# Patient Record
Sex: Female | Born: 1960 | Race: White | Hispanic: No | Marital: Married | Smoking: Former smoker
Health system: Southern US, Community
[De-identification: ages and names within clinical notes are randomized; demographics above are authoritative.]

## PROBLEM LIST (undated history)

## (undated) DIAGNOSIS — T7840XA Allergy, unspecified, initial encounter: Secondary | ICD-10-CM

## (undated) DIAGNOSIS — F419 Anxiety disorder, unspecified: Secondary | ICD-10-CM

## (undated) DIAGNOSIS — E7800 Pure hypercholesterolemia, unspecified: Secondary | ICD-10-CM

## (undated) HISTORY — DX: Anxiety disorder, unspecified: F41.9

## (undated) HISTORY — PX: RHINOPLASTY: SUR1284

## (undated) HISTORY — DX: Allergy, unspecified, initial encounter: T78.40XA

## (undated) HISTORY — PX: POLYPECTOMY: SHX149

## (undated) HISTORY — DX: Pure hypercholesterolemia, unspecified: E78.00

## (undated) HISTORY — PX: TUBAL LIGATION: SHX77

---

## 2007-08-08 ENCOUNTER — Encounter: Admission: RE | Admit: 2007-08-08 | Discharge: 2007-08-08 | Payer: Self-pay | Admitting: Obstetrics and Gynecology

## 2010-01-14 ENCOUNTER — Emergency Department (HOSPITAL_COMMUNITY): Admission: EM | Admit: 2010-01-14 | Discharge: 2010-01-15 | Payer: Self-pay | Admitting: Emergency Medicine
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
## 2011-07-26 ENCOUNTER — Other Ambulatory Visit: Payer: Self-pay | Admitting: Internal Medicine

## 2011-07-26 DIAGNOSIS — N6310 Unspecified lump in the right breast, unspecified quadrant: Secondary | ICD-10-CM

## 2011-08-02 ENCOUNTER — Ambulatory Visit
Admission: RE | Admit: 2011-08-02 | Discharge: 2011-08-02 | Disposition: A | Discharge status: Home / Self Care | Payer: BC Managed Care – PPO | Source: Ambulatory Visit | Attending: Internal Medicine | Admitting: Internal Medicine

## 2011-08-02 ENCOUNTER — Other Ambulatory Visit: Payer: Self-pay | Admitting: Internal Medicine

## 2011-08-02 DIAGNOSIS — N6310 Unspecified lump in the right breast, unspecified quadrant: Secondary | ICD-10-CM

## 2011-08-08 ENCOUNTER — Other Ambulatory Visit: Payer: Self-pay

## 2011-10-16 HISTORY — PX: COLONOSCOPY: SHX174

## 2012-07-30 ENCOUNTER — Encounter: Payer: Self-pay | Admitting: Internal Medicine

## 2012-08-04 ENCOUNTER — Other Ambulatory Visit: Payer: Self-pay | Admitting: Internal Medicine

## 2012-08-04 DIAGNOSIS — Z1231 Encounter for screening mammogram for malignant neoplasm of breast: Secondary | ICD-10-CM

## 2012-09-05 ENCOUNTER — Ambulatory Visit: Payer: BC Managed Care – PPO

## 2012-09-08 ENCOUNTER — Ambulatory Visit (AMBULATORY_SURGERY_CENTER): Payer: BC Managed Care – PPO

## 2012-09-08 VITALS — Ht 66.0 in | Wt 181.0 lb

## 2012-09-08 DIAGNOSIS — Z1211 Encounter for screening for malignant neoplasm of colon: Secondary | ICD-10-CM

## 2012-09-08 MED ORDER — MOVIPREP 100 G PO SOLR: 1.0000 | Freq: Once | ORAL | Status: DC

## 2012-09-25 ENCOUNTER — Encounter: Payer: Self-pay | Admitting: Internal Medicine

## 2012-09-25 ENCOUNTER — Ambulatory Visit (AMBULATORY_SURGERY_CENTER): Payer: BC Managed Care – PPO | Admitting: Internal Medicine

## 2012-09-25 VITALS — BP 121/66 | HR 71 | Temp 98.2°F | Resp 26 | Ht 66.0 in | Wt 181.0 lb

## 2012-09-25 DIAGNOSIS — Z1211 Encounter for screening for malignant neoplasm of colon: Secondary | ICD-10-CM

## 2012-09-25 DIAGNOSIS — D126 Benign neoplasm of colon, unspecified: Secondary | ICD-10-CM

## 2012-09-25 DIAGNOSIS — K635 Polyp of colon: Secondary | ICD-10-CM

## 2012-09-25 MED ORDER — SODIUM CHLORIDE 0.9 % IV SOLN: 500.0000 mL | INTRAVENOUS | Status: DC

## 2012-09-25 NOTE — Progress Notes (Signed)
Patient did not have preoperative order for IV antibiotic SSI prophylaxis. [phone])  Patient did not experience any of the following events: a burn prior to discharge; a fall within the facility; wrong site/side/patient/procedure/implant event; or a hospital transfer or hospital admission upon discharge from the facility. [phone]) Per Haynes Bast, RN

## 2012-09-25 NOTE — Progress Notes (Signed)
Called to room to assist during endoscopic procedure.  Patient ID and intended procedure confirmed with present staff. Received instructions for my participation in the procedure from the performing physician.  

## 2012-09-25 NOTE — Patient Instructions (Addendum)
Impressions/recommendations:  Polyps (handout given) Diverticulosis (handout given) Hemorrhoids (handout given)  High Fiber Diet given  Repeat colonoscopy pending upon pathology results.  YOU HAD AN ENDOSCOPIC PROCEDURE TODAY AT THE [facility]: Refer to the procedure report that was given to you for any specific questions about what was found during the examination.  If the procedure report does not answer your questions, please call your gastroenterologist to clarify.  If you requested that your care partner not be given the details of your procedure findings, then the procedure report has been included in a sealed envelope for you to review at your convenience later.  YOU SHOULD EXPECT: Some feelings of bloating in the abdomen. Passage of more gas than usual.  Walking can help get rid of the air that was put into your GI tract during the procedure and reduce the bloating. If you had a lower endoscopy (such as a colonoscopy or flexible sigmoidoscopy) you may notice spotting of blood in your stool or on the toilet paper. If you underwent a bowel prep for your procedure, then you may not have a normal bowel movement for a few days.  DIET: Your first meal following the procedure should be a light meal and then it is ok to progress to your normal diet.  A half-sandwich or bowl of soup is an example of a good first meal.  Heavy or fried foods are harder to digest and may make you feel nauseous or bloated.  Likewise meals heavy in dairy and vegetables can cause extra gas to form and this can also increase the bloating.  Drink plenty of fluids but you should avoid alcoholic beverages for 24 hours.  ACTIVITY: Your care partner should take you home directly after the procedure.  You should plan to take it easy, moving slowly for the rest of the day.  You can resume normal activity the day after the procedure however you should NOT DRIVE or use heavy machinery for 24 hours (because of the  sedation medicines used during the test).    SYMPTOMS TO REPORT IMMEDIATELY: A gastroenterologist can be reached at any hour.  During normal business hours, 8:30 AM to 5:00 PM Monday through Friday, call [phone].  After hours and on weekends, please call the GI answering service at [phone] who will take a message and have the physician on call contact you.   Following lower endoscopy (colonoscopy or flexible sigmoidoscopy):  Excessive amounts of blood in the stool  Significant tenderness or worsening of abdominal pains  Swelling of the abdomen that is new, acute  Fever of 100F or higher   FOLLOW UP: If any biopsies were taken you will be contacted by phone or by letter within the next 1-3 weeks.  Call your gastroenterologist if you have not heard about the biopsies in 3 weeks.  Our staff will call the home number listed on your records the next business day following your procedure to check on you and address any questions or concerns that you may have at that time regarding the information given to you following your procedure. This is a courtesy call and so if there is no answer at the home number and we have not heard from you through the emergency physician on call, we will assume that you have returned to your regular daily activities without incident.  SIGNATURES/CONFIDENTIALITY: You and/or your care partner have signed paperwork which will be entered into your electronic medical record.  These signatures attest to the  fact that that the information above on your After Visit Summary has been reviewed and is understood.  Full responsibility of the confidentiality of this discharge information lies with you and/or your care-partner.
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
## 2012-09-25 NOTE — Op Note (Signed)
[facility] [address] [place], [address]   COLONOSCOPY PROCEDURE REPORT  PATIENT: Christy Diaz, Christy Diaz  MR#: [id number] BIRTHDATE: 08-19-1961 , 51  yrs. old GENDER: Female ENDOSCOPIST: Beverley Fiedler, MD REFERRED AO:[id number], Daniel PROCEDURE DATE:  09/25/2012 PROCEDURE:   Colonoscopy with snare polypectomy ASA CLASS:   Class I INDICATIONS:average risk screening and first colonoscopy. MEDICATIONS: MAC sedation, administered by CRNA and propofol (Diprivan) 300mg  IV  DESCRIPTION OF PROCEDURE:   After the risks benefits and alternatives of the procedure were thoroughly explained, informed consent was obtained.  A digital rectal exam revealed no rectal mass and A digital rectal exam revealed external hemorrhoids.   The LB [id number]  endoscope was introduced through the anus and advanced to the cecum, which was identified by both the appendix and ileocecal valve. No adverse events experienced.   The quality of the prep was good, using MoviPrep  The instrument was then slowly withdrawn as the colon was fully examined.  COLON FINDINGS: Two sessile polyps measuring 3-5 mm in size were found at the hepatic flexure.  Polypectomy was performed with cold forceps (1) and using cold snare (1).  All resections were complete and all polyp tissue was completely retrieved.   A sessile polyp measuring 4 mm in size with a mucous cap was found in the rectum. A polypectomy was performed with a cold snare.  The resection was complete and the polyp tissue was completely retrieved.   Mild diverticulosis was noted in the sigmoid colon. Retroflexed views revealed small external hemorrhoids. The time to cecum=3 minutes 39 seconds.  Withdrawal time=13 minutes 49 seconds.  The scope was withdrawn and the procedure completed. COMPLICATIONS: There were no complications.  ENDOSCOPIC IMPRESSION: 1.   Two sessile polyps measuring 3-5 mm in size were found at the hepatic flexure;  Polypectomy was performed with cold forceps and using cold snare 2.   Sessile polyp measuring 4 mm in size was found in the rectum; polypectomy was performed with a cold snare 3.   Mild diverticulosis was noted in the sigmoid colon 4.   Small external hemorrhoids  RECOMMENDATIONS: 1.  Await pathology results 2.  High fiber diet 3.  If the polyps removed today are proven to be adenomatous (pre-cancerous) polyps, you will need a colonoscopy in 3 years. Otherwise you should continue to follow colorectal cancer screening guidelines for "routine risk" patients with a colonoscopy in 10 years.  You will receive a letter within 1-2 weeks with the results of your biopsy as well as final recommendations.  Please call my office if you have not received a letter after 3 weeks.  eSigned:  Beverley Fiedler, MD 09/25/2012 11:25 AM     cc: Jarome Matin, MD and The Patient

## 2012-09-26 ENCOUNTER — Telehealth: Payer: Self-pay | Admitting: *Deleted

## 2012-09-26 NOTE — Telephone Encounter (Signed)
  Follow up Call-  Call back number 09/25/2012  Post procedure Call Back phone  # [phone]  Permission to leave phone message Yes     Patient questions:  Do you have a fever, pain , or abdominal swelling? no Pain Score  0 *  Have you tolerated food without any problems? yes  Have you been able to return to your normal activities? yes  Do you have any questions about your discharge instructions: Diet   no Medications  no Follow up visit  no  Do you have questions or concerns about your Care? no  Actions: * If pain score is 4 or above: No action needed, pain <4.

## 2012-10-01 ENCOUNTER — Encounter: Payer: Self-pay | Admitting: Internal Medicine

## 2012-10-22 ENCOUNTER — Ambulatory Visit
Admission: RE | Admit: 2012-10-22 | Discharge: 2012-10-22 | Disposition: A | Discharge status: Home / Self Care | Payer: BC Managed Care – PPO | Source: Ambulatory Visit | Attending: Internal Medicine | Admitting: Internal Medicine

## 2012-10-22 DIAGNOSIS — Z1231 Encounter for screening mammogram for malignant neoplasm of breast: Secondary | ICD-10-CM

## 2014-01-05 ENCOUNTER — Other Ambulatory Visit: Payer: Self-pay

## 2014-01-05 DIAGNOSIS — Z1231 Encounter for screening mammogram for malignant neoplasm of breast: Secondary | ICD-10-CM

## 2014-01-15 ENCOUNTER — Ambulatory Visit
Admission: RE | Admit: 2014-01-15 | Discharge: 2014-01-15 | Disposition: A | Discharge status: Home / Self Care | Payer: BC Managed Care – PPO | Source: Ambulatory Visit

## 2014-01-15 DIAGNOSIS — Z1231 Encounter for screening mammogram for malignant neoplasm of breast: Secondary | ICD-10-CM

## 2014-10-15 DIAGNOSIS — H9190 Unspecified hearing loss, unspecified ear: Secondary | ICD-10-CM

## 2014-10-15 HISTORY — DX: Unspecified hearing loss, unspecified ear: H91.90

## 2015-05-06 ENCOUNTER — Other Ambulatory Visit: Payer: Self-pay

## 2015-05-06 DIAGNOSIS — Z1231 Encounter for screening mammogram for malignant neoplasm of breast: Secondary | ICD-10-CM

## 2015-06-09 ENCOUNTER — Ambulatory Visit
Admission: RE | Admit: 2015-06-09 | Discharge: 2015-06-09 | Disposition: A | Discharge status: Home / Self Care | Payer: 59 | Source: Ambulatory Visit

## 2015-06-09 DIAGNOSIS — Z1231 Encounter for screening mammogram for malignant neoplasm of breast: Secondary | ICD-10-CM

## 2016-08-14 ENCOUNTER — Other Ambulatory Visit: Payer: Self-pay | Admitting: Internal Medicine

## 2016-08-14 DIAGNOSIS — Z1231 Encounter for screening mammogram for malignant neoplasm of breast: Secondary | ICD-10-CM

## 2016-09-10 ENCOUNTER — Ambulatory Visit
Admission: RE | Admit: 2016-09-10 | Discharge: 2016-09-10 | Disposition: A | Discharge status: Home / Self Care | Payer: 59 | Source: Ambulatory Visit | Attending: Internal Medicine | Admitting: Internal Medicine

## 2016-09-10 DIAGNOSIS — Z1231 Encounter for screening mammogram for malignant neoplasm of breast: Secondary | ICD-10-CM

## 2017-04-02 ENCOUNTER — Encounter: Payer: Self-pay | Admitting: Obstetrics & Gynecology

## 2017-04-02 ENCOUNTER — Ambulatory Visit (INDEPENDENT_AMBULATORY_CARE_PROVIDER_SITE_OTHER): Payer: 59 | Admitting: Obstetrics & Gynecology

## 2017-04-02 VITALS — BP 130/70 | Ht 66.0 in | Wt 175.0 lb

## 2017-04-02 DIAGNOSIS — N393 Stress incontinence (female) (male): Secondary | ICD-10-CM

## 2017-04-02 DIAGNOSIS — N952 Postmenopausal atrophic vaginitis: Secondary | ICD-10-CM

## 2017-04-02 DIAGNOSIS — Z01411 Encounter for gynecological examination (general) (routine) with abnormal findings: Secondary | ICD-10-CM | POA: Diagnosis not present

## 2017-04-02 DIAGNOSIS — Z1151 Encounter for screening for human papillomavirus (HPV): Secondary | ICD-10-CM

## 2017-04-02 MED ORDER — ESTROGENS, CONJUGATED 0.625 MG/GM VA CREA: 0.2500 | TOPICAL_CREAM | VAGINAL | 4 refills | Status: DC

## 2017-04-02 NOTE — Patient Instructions (Signed)
1. Encounter for gynecological examination with abnormal finding Normal Gyn exam except Atrophic vaginitis.  Pap/HPV done.  Breast exam normal.  Will schedule screening Mammo and Colonoscopy.  2. Postmenopausal atrophic vaginitis Will try Premarin cream.  If not helpful or too expensive, will change to Intrarosa.  Info and pamphlet given.  3. Urinary, incontinence, stress female Kegels recommended and instructions given.  Christy Diaz, it was a pleasure to meet you today!  I will inform you of your results as soon as available.   Kegel Exercises Kegel exercises help strengthen the muscles that support the rectum, vagina, small intestine, bladder, and uterus. Doing Kegel exercises can help:  Improve bladder and bowel control.  Improve sexual response.  Reduce problems and discomfort during pregnancy.  Kegel exercises involve squeezing your pelvic floor muscles, which are the same muscles you squeeze when you try to stop the flow of urine. The exercises can be done while sitting, standing, or lying down, but it is best to vary your position. Phase 1 exercises 1. Squeeze your pelvic floor muscles tight. You should feel a tight lift in your rectal area. If you are a female, you should also feel a tightness in your vaginal area. Keep your stomach, buttocks, and legs relaxed. 2. Hold the muscles tight for up to 10 seconds. 3. Relax your muscles. Repeat this exercise 50 times a day or as many times as told by your health care provider. Continue to do this exercise for at least 4-6 weeks or for as long as told by your health care provider. This information is not intended to replace advice given to you by your health care provider. Make sure you discuss any questions you have with your health care provider. Document Released: 09/17/2012 Document Revised: 05/26/2016 Document Reviewed: 08/21/2015 Elsevier Interactive Patient Education  [employer].

## 2017-04-02 NOTE — Addendum Note (Signed)
Addended by: Thurnell Garbe A on: 04/02/2017 12:24 PM   Modules accepted: Orders

## 2017-04-02 NOTE — Progress Notes (Signed)
Christy Diaz 1960-11-14 [id number]   History:    56 y.o.  G2P1A1  Married.  S/P BT/S.  Taking care of mother with Alzheimer.  Son 14 yo at [facility] [employer] [facility] [employer].  RP:  New patient presenting for annual gyn exam  HPI:  Menopause.  No HRT.  No PMB.  No pelvic pain.  Vaginal dryness with painful intercourse.  Prescribed Estrace in the past, but too expensive.  Breasts wnl.  Mild SUI, will do Kegels.  BMs wnl.  Tennis player.  BMI 28,25.    Past medical history,surgical history, family history and social history were all reviewed and documented in the EPIC chart.  Gynecologic History No LMP recorded. Patient is postmenopausal. Contraception: post menopausal status and tubal ligation Last Pap: 2014. Results were: normal Last mammogram: 08/2016. Results were: normal Colono:  Benign polyps removed 09/2012.  Will schedule this year at 4 yrs. Obstetric History OB History  Gravida Para Term Preterm AB Living  2 1     1 1   SAB TAB Ectopic Multiple Live Births               # Outcome Date GA Lbr Len/2nd Weight Sex Delivery Anes PTL Lv  2 AB           1 Para                ROS: A ROS was performed and pertinent positives and negatives are included in the history.  GENERAL: No fevers or chills. HEENT: No change in vision, no earache, sore throat or sinus congestion. NECK: No pain or stiffness. CARDIOVASCULAR: No chest pain or pressure. No palpitations. PULMONARY: No shortness of breath, cough or wheeze. GASTROINTESTINAL: No abdominal pain, nausea, vomiting or diarrhea, melena or bright red blood per rectum. GENITOURINARY: No urinary frequency, urgency, hesitancy or dysuria. MUSCULOSKELETAL: No joint or muscle pain, no back pain, no recent trauma. DERMATOLOGIC: No rash, no itching, no lesions. ENDOCRINE: No polyuria, polydipsia, no heat or cold intolerance. No recent change in weight. HEMATOLOGICAL: No anemia or easy bruising or bleeding. NEUROLOGIC: No headache, seizures, numbness, tingling or  weakness. PSYCHIATRIC: No depression, no loss of interest in normal activity or change in sleep pattern.     Exam:   BP 130/70   Ht 5\' 6"  (1.676 m)   Wt 175 lb (79.4 kg)   BMI 28.25 kg/m   Body mass index is 28.25 kg/m.  General appearance : Well developed well nourished female. No acute distress HEENT: Eyes: no retinal hemorrhage or exudates,  Neck supple, trachea midline, no carotid bruits, no thyroidmegaly Lungs: Clear to auscultation, no rhonchi or wheezes, or rib retractions  Heart: Regular rate and rhythm, no murmurs or gallops Breast:Examined in sitting and supine position were symmetrical in appearance, no palpable masses or tenderness,  no skin retraction, no nipple inversion, no nipple discharge, no skin discoloration, no axillary or supraclavicular lymphadenopathy Abdomen: no palpable masses or tenderness, no rebound or guarding Extremities: no edema or skin discoloration or tenderness  Pelvic: Vulva Mild Atrophy  Bartholin, Urethra, Skene Glands: Within normal limits             Vagina: No gross lesions or discharge  Cervix: No gross lesions or discharge.  Pap/HPV done.  Uterus  AV, normal size, shape and consistency, non-tender and mobile  Adnexa  Without masses or tenderness  Anus and perineum  normal     Assessment/Plan:  56 y.o. female for annual exam   1. Encounter for  gynecological examination with abnormal finding Normal Gyn exam except Atrophic vaginitis.  Pap/HPV done.  Breast exam normal.  Will schedule screening Mammo and Colonoscopy.  2. Postmenopausal atrophic vaginitis Will try Premarin cream.  If not helpful or too expensive, will change to Intrarosa.  Info and pamphlet given.  3. Urinary, incontinence, stress female Kegels recommended and instructions given.  Counseling on above issues >50% x 10 minutes  Princess Bruins MD, 10:44 AM 04/02/2017

## 2017-04-05 LAB — PAP, TP IMAGING W/ HPV RNA, RFLX HPV TYPE 16,18/45: HPV mRNA, High Risk: NOT DETECTED

## 2017-09-17 ENCOUNTER — Other Ambulatory Visit: Payer: Self-pay | Admitting: Internal Medicine

## 2017-09-17 DIAGNOSIS — Z1231 Encounter for screening mammogram for malignant neoplasm of breast: Secondary | ICD-10-CM

## 2017-09-19 ENCOUNTER — Ambulatory Visit
Admission: RE | Admit: 2017-09-19 | Discharge: 2017-09-19 | Disposition: A | Discharge status: Home / Self Care | Payer: 59 | Source: Ambulatory Visit | Attending: Internal Medicine | Admitting: Internal Medicine

## 2017-09-19 DIAGNOSIS — Z1231 Encounter for screening mammogram for malignant neoplasm of breast: Secondary | ICD-10-CM

## 2017-10-18 ENCOUNTER — Encounter: Payer: Self-pay | Admitting: Internal Medicine

## 2018-07-31 ENCOUNTER — Other Ambulatory Visit: Payer: Self-pay | Admitting: Internal Medicine

## 2018-07-31 DIAGNOSIS — Z1231 Encounter for screening mammogram for malignant neoplasm of breast: Secondary | ICD-10-CM

## 2018-09-22 ENCOUNTER — Ambulatory Visit
Admission: RE | Admit: 2018-09-22 | Discharge: 2018-09-22 | Disposition: A | Discharge status: Home / Self Care | Payer: Managed Care, Other (non HMO) | Source: Ambulatory Visit | Attending: Internal Medicine | Admitting: Internal Medicine

## 2018-09-22 DIAGNOSIS — Z1231 Encounter for screening mammogram for malignant neoplasm of breast: Secondary | ICD-10-CM

## 2018-10-20 ENCOUNTER — Encounter: Payer: Self-pay | Admitting: Obstetrics & Gynecology

## 2018-10-20 ENCOUNTER — Ambulatory Visit (INDEPENDENT_AMBULATORY_CARE_PROVIDER_SITE_OTHER): Payer: Managed Care, Other (non HMO) | Admitting: Obstetrics & Gynecology

## 2018-10-20 VITALS — BP 124/80 | Ht 65.75 in | Wt 187.0 lb

## 2018-10-20 DIAGNOSIS — N952 Postmenopausal atrophic vaginitis: Secondary | ICD-10-CM

## 2018-10-20 DIAGNOSIS — Z780 Asymptomatic menopausal state: Secondary | ICD-10-CM

## 2018-10-20 DIAGNOSIS — E6609 Other obesity due to excess calories: Secondary | ICD-10-CM

## 2018-10-20 DIAGNOSIS — Z01419 Encounter for gynecological examination (general) (routine) without abnormal findings: Secondary | ICD-10-CM

## 2018-10-20 DIAGNOSIS — Z6830 Body mass index (BMI) 30.0-30.9, adult: Secondary | ICD-10-CM

## 2018-10-20 NOTE — Progress Notes (Signed)
Christy Diaz 1961-09-17 [id number]   History:    58 y.o. G2P1A1L1 Married  RP:  Established patient presenting for annual gyn exam   HPI: Menopause, well on no HRT.  No PMB.  No pelvic pain.  Rarely sexually active because of dryness/pain with IC.  Urine/BMs wnl.  BMI 30.41.  Plays tennis 2 times/week.  Health labs with Fam MD.  Past medical history,surgical history, family history and social history were all reviewed and documented in the EPIC chart.  Gynecologic History No LMP recorded. Patient is postmenopausal. Contraception: post menopausal status Last Pap: 03/2017. Results were: Negative/HPV HR neg Last mammogram: 09/2018. Results were: Negative Bone Density: Never Colonoscopy: 2013  Obstetric History OB History  Gravida Para Term Preterm AB Living  2 1     1 1   SAB TAB Ectopic Multiple Live Births               # Outcome Date GA Lbr Len/2nd Weight Sex Delivery Anes PTL Lv  2 AB           1 Para              ROS: A ROS was performed and pertinent positives and negatives are included in the history.  GENERAL: No fevers or chills. HEENT: No change in vision, no earache, sore throat or sinus congestion. NECK: No pain or stiffness. CARDIOVASCULAR: No chest pain or pressure. No palpitations. PULMONARY: No shortness of breath, cough or wheeze. GASTROINTESTINAL: No abdominal pain, nausea, vomiting or diarrhea, melena or bright red blood per rectum. GENITOURINARY: No urinary frequency, urgency, hesitancy or dysuria. MUSCULOSKELETAL: No joint or muscle pain, no back pain, no recent trauma. DERMATOLOGIC: No rash, no itching, no lesions. ENDOCRINE: No polyuria, polydipsia, no heat or cold intolerance. No recent change in weight. HEMATOLOGICAL: No anemia or easy bruising or bleeding. NEUROLOGIC: No headache, seizures, numbness, tingling or weakness. PSYCHIATRIC: No depression, no loss of interest in normal activity or change in sleep pattern.     Exam:   BP 124/80   Ht 5' 5.75"  (1.67 m)   Wt 187 lb (84.8 kg)   BMI 30.41 kg/m   Body mass index is 30.41 kg/m.  General appearance : Well developed well nourished female. No acute distress HEENT: Eyes: no retinal hemorrhage or exudates,  Neck supple, trachea midline, no carotid bruits, no thyroidmegaly Lungs: Clear to auscultation, no rhonchi or wheezes, or rib retractions  Heart: Regular rate and rhythm, no murmurs or gallops Breast:Examined in sitting and supine position were symmetrical in appearance, no palpable masses or tenderness,  no skin retraction, no nipple inversion, no nipple discharge, no skin discoloration, no axillary or supraclavicular lymphadenopathy Abdomen: no palpable masses or tenderness, no rebound or guarding Extremities: no edema or skin discoloration or tenderness  Pelvic: Vulva: Normal             Vagina: No gross lesions or discharge  Cervix: No gross lesions or discharge  Uterus AV, normal size, shape and consistency, non-tender and mobile  Adnexa  Without masses or tenderness  Anus: Normal   Assessment/Plan:  58 y.o. female for annual exam   1. Well female exam with routine gynecological exam Normal gynecologic exam and menopause.  Pap test negative with negative high-risk HPV in June 2018.  No indication to repeat a Pap as this year.  Breast exam normal.  Last screening mammogram December 2019 was negative.  Colonoscopy in 2013.  Health labs with family physician.  2. Postmenopausal  Well on no hormone replacement therapy.  No postmenopausal bleeding.  Recommend vitamin D supplements, calcium intake of 1500 mg daily and regular weightbearing physical activities.  3. Postmenopausal atrophic vaginitis Decision not to start on an estrogen cream at this time.  Recommend coconut oil when sexually active.  Usage reviewed with patient.  4. Class 1 obesity due to excess calories without serious comorbidity with body mass index (BMI) of 30.0 to 30.9 in adult Recommend a lower calorie/carb  diet such as [employer].  Patient will start back with walking regularly and continue with tennis twice a week.  Recommend weightlifting every 2 days.  Other orders - ALPRAZolam (XANAX) 1 MG tablet; Take 1 mg by mouth at bedtime as needed for anxiety. - pravastatin (PRAVACHOL) 40 MG tablet; Take 40 mg by mouth daily.  Princess Bruins MD, 3:41 PM 10/20/2018

## 2018-10-20 NOTE — Patient Instructions (Signed)
1. Well female exam with routine gynecological exam Normal gynecologic exam and menopause.  Pap test negative with negative high-risk HPV in June 2018.  No indication to repeat a Pap as this year.  Breast exam normal.  Last screening mammogram December 2019 was negative.  Colonoscopy in 2013.  Health labs with family physician.  2. Postmenopausal Well on no hormone replacement therapy.  No postmenopausal bleeding.  Recommend vitamin D supplements, calcium intake of 1500 mg daily and regular weightbearing physical activities.  3. Postmenopausal atrophic vaginitis Decision not to start on an estrogen cream at this time.  Recommend coconut oil when sexually active.  Usage reviewed with patient.  4. Class 1 obesity due to excess calories without serious comorbidity with body mass index (BMI) of 30.0 to 30.9 in adult Recommend a lower calorie/carb diet such as [employer].  Patient will start back with walking regularly and continue with tennis twice a week.  Recommend weightlifting every 2 days.  Other orders - ALPRAZolam (XANAX) 1 MG tablet; Take 1 mg by mouth at bedtime as needed for anxiety. - pravastatin (PRAVACHOL) 40 MG tablet; Take 40 mg by mouth daily.  Christy Diaz, it was a pleasure seeing you today!

## 2019-08-25 ENCOUNTER — Other Ambulatory Visit: Payer: Self-pay | Admitting: Internal Medicine

## 2019-08-25 DIAGNOSIS — Z1231 Encounter for screening mammogram for malignant neoplasm of breast: Secondary | ICD-10-CM

## 2019-09-01 ENCOUNTER — Other Ambulatory Visit: Payer: Self-pay

## 2019-09-01 DIAGNOSIS — Z20822 Contact with and (suspected) exposure to covid-19: Secondary | ICD-10-CM

## 2019-09-02 LAB — NOVEL CORONAVIRUS, NAA: SARS-CoV-2, NAA: NOT DETECTED

## 2019-10-15 ENCOUNTER — Ambulatory Visit
Admission: RE | Admit: 2019-10-15 | Discharge: 2019-10-15 | Disposition: A | Discharge status: Home / Self Care | Payer: Managed Care, Other (non HMO) | Source: Ambulatory Visit | Attending: Internal Medicine | Admitting: Internal Medicine

## 2019-10-15 ENCOUNTER — Other Ambulatory Visit: Payer: Self-pay

## 2019-10-15 DIAGNOSIS — Z1231 Encounter for screening mammogram for malignant neoplasm of breast: Secondary | ICD-10-CM

## 2019-10-22 ENCOUNTER — Encounter: Payer: Self-pay | Admitting: Internal Medicine

## 2019-11-09 ENCOUNTER — Ambulatory Visit (AMBULATORY_SURGERY_CENTER): Payer: Self-pay

## 2019-11-09 ENCOUNTER — Other Ambulatory Visit: Payer: Self-pay

## 2019-11-09 VITALS — Temp 97.1°F | Ht 65.75 in | Wt 189.2 lb

## 2019-11-09 DIAGNOSIS — Z86010 Personal history of colonic polyps: Secondary | ICD-10-CM

## 2019-11-09 DIAGNOSIS — Z01818 Encounter for other preprocedural examination: Secondary | ICD-10-CM

## 2019-11-09 MED ORDER — NA SULFATE-K SULFATE-MG SULF 17.5-3.13-1.6 GM/177ML PO SOLN: 1.0000 | Freq: Once | ORAL | 0 refills | Status: AC

## 2019-11-09 NOTE — Progress Notes (Signed)

## 2019-11-18 ENCOUNTER — Encounter: Payer: Self-pay | Admitting: Internal Medicine

## 2019-11-18 ENCOUNTER — Ambulatory Visit (INDEPENDENT_AMBULATORY_CARE_PROVIDER_SITE_OTHER): Payer: Managed Care, Other (non HMO)

## 2019-11-18 ENCOUNTER — Other Ambulatory Visit: Payer: Self-pay | Admitting: Internal Medicine

## 2019-11-18 DIAGNOSIS — Z1159 Encounter for screening for other viral diseases: Secondary | ICD-10-CM

## 2019-11-18 LAB — SARS CORONAVIRUS 2 (TAT 6-24 HRS): SARS Coronavirus 2: NEGATIVE

## 2019-11-23 ENCOUNTER — Encounter: Payer: Self-pay | Admitting: Internal Medicine

## 2019-11-23 ENCOUNTER — Ambulatory Visit (AMBULATORY_SURGERY_CENTER): Payer: Managed Care, Other (non HMO) | Admitting: Internal Medicine

## 2019-11-23 ENCOUNTER — Other Ambulatory Visit: Payer: Self-pay

## 2019-11-23 VITALS — BP 136/70 | HR 75 | Temp 96.8°F | Resp 19 | Ht 65.75 in | Wt 189.2 lb

## 2019-11-23 DIAGNOSIS — Z86010 Personal history of colonic polyps: Secondary | ICD-10-CM

## 2019-11-23 MED ORDER — SODIUM CHLORIDE 0.9 % IV SOLN: 500.0000 mL | Freq: Once | INTRAVENOUS | Status: DC

## 2019-11-23 NOTE — Progress Notes (Signed)
Pt's states no medical or surgical changes since previsit or office visit.  Temp JB VS DT  

## 2019-11-23 NOTE — Op Note (Signed)
[name] Patient Name: Christy Diaz Procedure Date: 11/23/2019 10:46 AM MRN: [id number] Endoscopist: Jerene Bears , MD Age: 59 Referring MD:  Date of Birth: May 24, 1961 Gender: Female Account #: [id number] Procedure:                Colonoscopy Indications:              High risk colon cancer surveillance: Personal                            history of non-advanced adenoma, Last colonoscopy:                            December 2013 Medicines:                Monitored Anesthesia Care Procedure:                Pre-Anesthesia Assessment:                           - Prior to the procedure, a History and Physical                            was performed, and patient medications and                            allergies were reviewed. The patient's tolerance of                            previous anesthesia was also reviewed. The risks                            and benefits of the procedure and the sedation                            options and risks were discussed with the patient.                            All questions were answered, and informed consent                            was obtained. Prior Anticoagulants: The patient has                            taken no previous anticoagulant or antiplatelet                            agents. ASA Grade Assessment: II - A patient with                            mild systemic disease. After reviewing the risks                            and benefits, the patient was deemed in  satisfactory condition to undergo the procedure.                           After obtaining informed consent, the colonoscope                            was passed under direct vision. Throughout the                            procedure, the patient's blood pressure, pulse, and                            oxygen saturations were monitored continuously. The                            Colonoscope was introduced through the anus and                            advanced to the cecum, identified by appendiceal                            orifice and ileocecal valve. The colonoscopy was                            performed without difficulty. The patient tolerated                            the procedure well. The quality of the bowel                            preparation was good. The ileocecal valve,                            appendiceal orifice, and rectum were photographed. Scope In: 10:52:17 AM Scope Out: 11:03:46 AM Scope Withdrawal Time: 0 hours 8 minutes 55 seconds  Total Procedure Duration: 0 hours 11 minutes 29 seconds  Findings:                 The digital rectal exam was normal.                           Multiple small and large-mouthed diverticula were                            found in the sigmoid colon.                           The exam was otherwise without abnormality on                            direct and retroflexion views. Complications:            No immediate complications. Estimated Blood Loss:     Estimated blood loss: none. Impression:               - Diverticulosis in the sigmoid  colon.                           - The examination was otherwise normal on direct                            and retroflexion views.                           - No specimens collected. Recommendation:           - Patient has a contact number available for                            emergencies. The signs and symptoms of potential                            delayed complications were discussed with the                            patient. Return to normal activities tomorrow.                            Written discharge instructions were provided to the                            patient.                           - Resume previous diet.                           - Continue present medications.                           - Repeat colonoscopy in 10 years for surveillance. Jerene Bears, MD 11/23/2019 11:06:17 AM This  report has been signed electronically.

## 2019-11-23 NOTE — Progress Notes (Signed)
PT taken to PACU. Monitors in place. VSS. Report given to RN. 

## 2019-11-23 NOTE — Patient Instructions (Signed)
Please read handouts provided. Continue present medications.     YOU HAD AN ENDOSCOPIC PROCEDURE TODAY AT THE [facility]:   Refer to the procedure report that was given to you for any specific questions about what was found during the examination.  If the procedure report does not answer your questions, please call your gastroenterologist to clarify.  If you requested that your care partner not be given the details of your procedure findings, then the procedure report has been included in a sealed envelope for you to review at your convenience later.  YOU SHOULD EXPECT: Some feelings of bloating in the abdomen. Passage of more gas than usual.  Walking can help get rid of the air that was put into your GI tract during the procedure and reduce the bloating. If you had a lower endoscopy (such as a colonoscopy or flexible sigmoidoscopy) you may notice spotting of blood in your stool or on the toilet paper. If you underwent a bowel prep for your procedure, you may not have a normal bowel movement for a few days.  Please Note:  You might notice some irritation and congestion in your nose or some drainage.  This is from the oxygen used during your procedure.  There is no need for concern and it should clear up in a day or so.  SYMPTOMS TO REPORT IMMEDIATELY:   Following lower endoscopy (colonoscopy or flexible sigmoidoscopy):  Excessive amounts of blood in the stool  Significant tenderness or worsening of abdominal pains  Swelling of the abdomen that is new, acute  Fever of 100F or higher   For urgent or emergent issues, a gastroenterologist can be reached at any hour by calling [phone].   DIET:  We do recommend a small meal at first, but then you may proceed to your regular diet.  Drink plenty of fluids but you should avoid alcoholic beverages for 24 hours.  ACTIVITY:  You should plan to take it easy for the rest of today and you should NOT DRIVE or use heavy machinery  until tomorrow (because of the sedation medicines used during the test).    FOLLOW UP: Our staff will call the number listed on your records 48-72 hours following your procedure to check on you and address any questions or concerns that you may have regarding the information given to you following your procedure. If we do not reach you, we will leave a message.  We will attempt to reach you two times.  During this call, we will ask if you have developed any symptoms of COVID 19. If you develop any symptoms (ie: fever, flu-like symptoms, shortness of breath, cough etc.) before then, please call [phone].  If you test positive for Covid 19 in the 2 weeks post procedure, please call and report this information to us.    If any biopsies were taken you will be contacted by phone or by letter within the next 1-3 weeks.  Please call us at [phone] if you have not heard about the biopsies in 3 weeks.    SIGNATURES/CONFIDENTIALITY: You and/or your care partner have signed paperwork which will be entered into your electronic medical record.  These signatures attest to the fact that that the information above on your After Visit Summary has been reviewed and is understood.  Full responsibility of the confidentiality of this discharge information lies with you and/or your care-partner. 

## 2019-11-25 ENCOUNTER — Telehealth: Payer: Self-pay

## 2019-11-25 NOTE — Telephone Encounter (Signed)
  Follow up Call-  Call back number 11/23/2019  Post procedure Call Back phone  # [phone]  Permission to leave phone message Yes  Some recent data might be hidden     Patient questions:  Do you have a fever, pain , or abdominal swelling? No. Pain Score  0 *  Have you tolerated food without any problems? Yes.    Have you been able to return to your normal activities? Yes.    Do you have any questions about your discharge instructions: Diet   No. Medications  No. Follow up visit  No.  Do you have questions or concerns about your Care? No.  Actions: * If pain score is 4 or above: No action needed, pain <4. 1. Have you developed a fever since your procedure? no  2.   Have you had an respiratory symptoms (SOB or cough) since your procedure? no  3.   Have you tested positive for COVID 19 since your procedure no  4.   Have you had any family members/close contacts diagnosed with the COVID 19 since your procedure?  no   If yes to any of these questions please route to Joylene John, RN and Alphonsa Gin, Therapist, sports.

## 2019-11-25 NOTE — Telephone Encounter (Signed)
First follow up call attempt, unable to LM

## 2020-11-25 ENCOUNTER — Other Ambulatory Visit: Payer: Self-pay | Admitting: Internal Medicine

## 2020-11-25 ENCOUNTER — Other Ambulatory Visit: Payer: Self-pay | Admitting: Student

## 2020-11-25 DIAGNOSIS — Z1231 Encounter for screening mammogram for malignant neoplasm of breast: Secondary | ICD-10-CM

## 2020-11-29 ENCOUNTER — Other Ambulatory Visit: Payer: Self-pay

## 2020-11-29 ENCOUNTER — Ambulatory Visit
Admission: RE | Admit: 2020-11-29 | Discharge: 2020-11-29 | Disposition: A | Discharge status: Home / Self Care | Payer: Managed Care, Other (non HMO) | Source: Ambulatory Visit

## 2020-11-29 DIAGNOSIS — Z1231 Encounter for screening mammogram for malignant neoplasm of breast: Secondary | ICD-10-CM

## 2021-01-23 ENCOUNTER — Ambulatory Visit (INDEPENDENT_AMBULATORY_CARE_PROVIDER_SITE_OTHER): Payer: Managed Care, Other (non HMO) | Admitting: Obstetrics & Gynecology

## 2021-01-23 ENCOUNTER — Other Ambulatory Visit (HOSPITAL_COMMUNITY)
Admission: RE | Admit: 2021-01-23 | Discharge: 2021-01-23 | Disposition: A | Discharge status: Home / Self Care | Payer: Managed Care, Other (non HMO) | Source: Ambulatory Visit | Attending: Obstetrics & Gynecology | Admitting: Obstetrics & Gynecology

## 2021-01-23 ENCOUNTER — Other Ambulatory Visit: Payer: Self-pay

## 2021-01-23 ENCOUNTER — Encounter: Payer: Self-pay | Admitting: Obstetrics & Gynecology

## 2021-01-23 ENCOUNTER — Ambulatory Visit: Payer: Managed Care, Other (non HMO) | Admitting: Obstetrics & Gynecology

## 2021-01-23 VITALS — BP 128/80 | Ht 65.5 in | Wt 189.0 lb

## 2021-01-23 DIAGNOSIS — E6609 Other obesity due to excess calories: Secondary | ICD-10-CM

## 2021-01-23 DIAGNOSIS — Z6830 Body mass index (BMI) 30.0-30.9, adult: Secondary | ICD-10-CM

## 2021-01-23 DIAGNOSIS — Z780 Asymptomatic menopausal state: Secondary | ICD-10-CM | POA: Diagnosis not present

## 2021-01-23 DIAGNOSIS — Z01419 Encounter for gynecological examination (general) (routine) without abnormal findings: Secondary | ICD-10-CM | POA: Diagnosis not present

## 2021-01-23 NOTE — Progress Notes (Signed)
Christy Diaz 1960/12/12 [id number]   History:    60 y.o. G2P1A1L1 Married  RP:  Established patient presenting for annual gyn exam   HPI: Postmenopause, well on no HRT.  No PMB.  No pelvic pain.  Rarely sexually active.  Urine/BMs wnl.    Breast normal.  Screening mammogram February 2022 Negative.  BMI 30.97.  Plays tennis 2 times/week.  Health labs with Fam MD. colonoscopy February 2021.  Past medical history,surgical history, family history and social history were all reviewed and documented in the EPIC chart.  Gynecologic History No LMP recorded. Patient is postmenopausal.  Obstetric History OB History  Gravida Para Term Preterm AB Living  2 1     1 1   SAB IAB Ectopic Multiple Live Births               # Outcome Date GA Lbr Len/2nd Weight Sex Delivery Anes PTL Lv  2 AB           1 Para              ROS: A ROS was performed and pertinent positives and negatives are included in the history.  GENERAL: No fevers or chills. HEENT: No change in vision, no earache, sore throat or sinus congestion. NECK: No pain or stiffness. CARDIOVASCULAR: No chest pain or pressure. No palpitations. PULMONARY: No shortness of breath, cough or wheeze. GASTROINTESTINAL: No abdominal pain, nausea, vomiting or diarrhea, melena or bright red blood per rectum. GENITOURINARY: No urinary frequency, urgency, hesitancy or dysuria. MUSCULOSKELETAL: No joint or muscle pain, no back pain, no recent trauma. DERMATOLOGIC: No rash, no itching, no lesions. ENDOCRINE: No polyuria, polydipsia, no heat or cold intolerance. No recent change in weight. HEMATOLOGICAL: No anemia or easy bruising or bleeding. NEUROLOGIC: No headache, seizures, numbness, tingling or weakness. PSYCHIATRIC: No depression, no loss of interest in normal activity or change in sleep pattern.     Exam:   BP 128/80   Ht 5' 5.5" (1.664 m)   Wt 189 lb (85.7 kg)   BMI 30.97 kg/m   Body mass index is 30.97 kg/m.  General appearance  : Well developed well nourished female. No acute distress HEENT: Eyes: no retinal hemorrhage or exudates,  Neck supple, trachea midline, no carotid bruits, no thyroidmegaly Lungs: Clear to auscultation, no rhonchi or wheezes, or rib retractions  Heart: Regular rate and rhythm, no murmurs or gallops Breast:Examined in sitting and supine position were symmetrical in appearance, no palpable masses or tenderness,  no skin retraction, no nipple inversion, no nipple discharge, no skin discoloration, no axillary or supraclavicular lymphadenopathy Abdomen: no palpable masses or tenderness, no rebound or guarding Extremities: no edema or skin discoloration or tenderness  Pelvic: Vulva: Normal             Vagina: No gross lesions or discharge  Cervix: No gross lesions or discharge.  Pap reflex done.  Uterus AV, normal size, shape and consistency, non-tender and mobile  Adnexa  Without masses or tenderness  Anus: Normal   Assessment/Plan:  60 y.o. female for annual exam   1. Encounter for routine gynecological examination with Papanicolaou smear of cervix Normal gynecologic exam.  Pap reflex done.  Breast exam normal.  Screening mammogram February 2022 was negative.  Colonoscopy February 2021.  Health labs with family physician. - Cytology - PAP( [facility])  2. Postmenopause Well on no hormone replacement therapy.  No postmenopausal bleeding.  Vitamin D supplements, calcium intake of 1.5 g/day and  regular weightbearing physical activity is recommended.  3. Class 1 obesity due to excess calories without serious comorbidity with body mass index (BMI) of 30.0 to 30.9 in adult Recommend a lower calorie/carb diet.  Aerobic activities 5 times a week and light weightlifting every 2 days.  Other orders - rosuvastatin (CRESTOR) 40 MG tablet; Take 40 mg by mouth daily.  Princess Bruins MD, 4:39 PM 01/23/2021

## 2021-01-25 LAB — CYTOLOGY - PAP: Diagnosis: NEGATIVE

## 2021-10-31 ENCOUNTER — Other Ambulatory Visit: Payer: Self-pay | Admitting: Internal Medicine

## 2021-10-31 DIAGNOSIS — Z1231 Encounter for screening mammogram for malignant neoplasm of breast: Secondary | ICD-10-CM

## 2021-12-05 DIAGNOSIS — Z1231 Encounter for screening mammogram for malignant neoplasm of breast: Secondary | ICD-10-CM

## 2021-12-13 ENCOUNTER — Ambulatory Visit: Payer: Self-pay

## 2022-07-03 ENCOUNTER — Ambulatory Visit
Admission: RE | Admit: 2022-07-03 | Discharge: 2022-07-03 | Disposition: A | Discharge status: Home / Self Care | Payer: Managed Care, Other (non HMO) | Source: Ambulatory Visit | Attending: Internal Medicine | Admitting: Internal Medicine

## 2022-07-03 DIAGNOSIS — Z1231 Encounter for screening mammogram for malignant neoplasm of breast: Secondary | ICD-10-CM

## 2023-06-03 ENCOUNTER — Other Ambulatory Visit: Payer: Self-pay | Admitting: Internal Medicine

## 2023-06-03 DIAGNOSIS — Z1231 Encounter for screening mammogram for malignant neoplasm of breast: Secondary | ICD-10-CM

## 2023-07-08 ENCOUNTER — Ambulatory Visit
Admission: RE | Admit: 2023-07-08 | Discharge: 2023-07-08 | Disposition: A | Discharge status: Home / Self Care | Payer: Self-pay | Source: Ambulatory Visit | Attending: Internal Medicine | Admitting: Internal Medicine

## 2023-07-08 DIAGNOSIS — Z1231 Encounter for screening mammogram for malignant neoplasm of breast: Secondary | ICD-10-CM

## 2023-08-07 ENCOUNTER — Ambulatory Visit (INDEPENDENT_AMBULATORY_CARE_PROVIDER_SITE_OTHER): Payer: Managed Care, Other (non HMO) | Admitting: Obstetrics and Gynecology

## 2023-08-07 ENCOUNTER — Encounter: Payer: Self-pay | Admitting: Obstetrics and Gynecology

## 2023-08-07 VITALS — BP 110/62 | HR 77 | Ht 65.0 in | Wt 171.0 lb

## 2023-08-07 DIAGNOSIS — E2839 Other primary ovarian failure: Secondary | ICD-10-CM

## 2023-08-07 DIAGNOSIS — Z01419 Encounter for gynecological examination (general) (routine) without abnormal findings: Secondary | ICD-10-CM | POA: Diagnosis not present

## 2023-08-07 NOTE — Progress Notes (Signed)
62 y.o. y.o. female here for annual exam. She denies any PM bleeding.   No LMP recorded. Patient is postmenopausal.   G16P1A1L1 Married 75 1 year old son   RP:  Established patient presenting for annual gyn exam    HPI: Postmenopause, well on no HRT.  No PMB.  No pelvic pain.  Rarely sexually active.  Urine/BMs wnl.    Breast normal.  Screening mammogram Sept 2024 Negative.  Body mass index is 28.46 kg/m.  Plays tennis 2 times/week.  Health labs with Fam MD. colonoscopy February 2021.  Blood pressure 110/62, pulse 77, height 5\' 5"  (1.651 m), weight 171 lb (77.6 kg), SpO2 99%.     Component Value Date/Time   DIAGPAP  01/23/2021 1651    - Negative for intraepithelial lesion or malignancy (NILM)   ADEQPAP  01/23/2021 1651    Satisfactory for evaluation; transformation zone component PRESENT.    GYN HISTORY:    Component Value Date/Time   DIAGPAP  01/23/2021 1651    - Negative for intraepithelial lesion or malignancy (NILM)   ADEQPAP  01/23/2021 1651    Satisfactory for evaluation; transformation zone component PRESENT.    OB History  Gravida Para Term Preterm AB Living  2 1     1 1   SAB IAB Ectopic Multiple Live Births               # Outcome Date GA Lbr Len/2nd Weight Sex Type Anes PTL Lv  2 AB           1 Para             Past Medical History:  Diagnosis Date   Allergy    seasonal   Anxiety    Hearing loss 2016   wears hearing aids   High cholesterol     Past Surgical History:  Procedure Laterality Date   COLONOSCOPY  2013   POLYPECTOMY     RHINOPLASTY     TUBAL LIGATION      Current Outpatient Medications on File Prior to Visit  Medication Sig Dispense Refill   ALPRAZolam (XANAX) 1 MG tablet Take 1 mg by mouth at bedtime as needed for anxiety.     ibuprofen (ADVIL) 200 MG tablet Take 400 mg by mouth every 6 (six) hours as needed for headache.     meloxicam (MOBIC) 15 MG tablet Take 15 mg by mouth daily as needed.     rosuvastatin (CRESTOR) 40 MG  tablet Take 40 mg by mouth daily.     No current facility-administered medications on file prior to visit.    Social History   Socioeconomic History   Marital status: Married    Spouse name: Not on file   Number of children: Not on file   Years of education: Not on file   Highest education level: Not on file  Occupational History   Not on file  Tobacco Use   Smoking status: Former    Current packs/day: 0.00    Types: Cigarettes    Quit date: 09/08/2000    Years since quitting: 22.9   Smokeless tobacco: Never  Vaping Use   Vaping status: Never Used  Substance and Sexual Activity   Alcohol use: Yes    Alcohol/week: 2.0 standard drinks of alcohol    Types: 1 Glasses of wine, 1 Cans of beer per week   Drug use: Never   Sexual activity: Yes    Partners: Male    Comment: 1ST INTERCOURSE-  16, PARTNERS- MORE THAN 5  Other Topics Concern   Not on file  Social History Narrative   Not on file   Social Determinants of Health   Financial Resource Strain: Not on file  Food Insecurity: Not on file  Transportation Needs: Not on file  Physical Activity: Not on file  Stress: Not on file  Social Connections: Not on file  Intimate Partner Violence: Not on file    Family History  Problem Relation Age of Onset   Heart disease Mother    Heart attack Mother        x2   Hypertension Mother    Cancer Father        leukemia   Diabetes Maternal Grandmother    Colon cancer Neg Hx    Colon polyps Neg Hx    Esophageal cancer Neg Hx    Rectal cancer Neg Hx    Stomach cancer Neg Hx      Allergies  Allergen Reactions   Latex Rash   Penicillins Rash    As a child doesn't remember reaction      Patient's last menstrual period was No LMP recorded. Patient is postmenopausal..             Review of Systems Alls systems reviewed and are negative.     Physical Exam Constitutional:      Appearance: Normal appearance.  Genitourinary:     Vulva and urethral meatus normal.      No lesions in the vagina.     Right Labia: No rash, lesions or skin changes.    Left Labia: No lesions, skin changes or rash.    No vaginal discharge or tenderness.     No vaginal prolapse present.    No vaginal atrophy present.     Right Adnexa: not tender, not palpable and no mass present.    Left Adnexa: not tender, not palpable and no mass present.    No cervical motion tenderness or discharge.     Uterus is not enlarged, tender or irregular.     Uterus is anteverted.  Breasts:    Right: Normal.     Left: Normal.  HENT:     Head: Normocephalic.  Neck:     Thyroid: No thyroid mass, thyromegaly or thyroid tenderness.  Cardiovascular:     Rate and Rhythm: Normal rate and regular rhythm.     Heart sounds: Normal heart sounds, S1 normal and S2 normal.  Pulmonary:     Effort: Pulmonary effort is normal.     Breath sounds: Normal breath sounds and air entry.  Abdominal:     General: There is no distension.     Palpations: Abdomen is soft. There is no mass.     Tenderness: There is no abdominal tenderness. There is no guarding or rebound.  Musculoskeletal:        General: Normal range of motion.     Cervical back: Full passive range of motion without pain, normal range of motion and neck supple. No tenderness.     Right lower leg: No edema.     Left lower leg: No edema.  Neurological:     Mental Status: She is alert.  Skin:    General: Skin is warm.  Psychiatric:        Mood and Affect: Mood normal.        Behavior: Behavior normal.        Thought Content: Thought content normal.  Vitals and nursing note reviewed.  Exam conducted with a chaperone present.       A:         Well Woman GYN exam                             P:        Pap smear up-to-date Encouraged annual mammogram screening Colon cancer screening up-to-date DXA ordered today Labs and immunizations to do with PMD Discussed breast self exams Encouraged healthy lifestyle practices Encouraged Vit D and  Calcium   No follow-ups on file.  Earley Favor

## 2024-03-19 DIAGNOSIS — E785 Hyperlipidemia, unspecified: Secondary | ICD-10-CM | POA: Diagnosis not present

## 2024-03-27 ENCOUNTER — Telehealth: Payer: Self-pay | Admitting: Obstetrics and Gynecology

## 2024-03-27 DIAGNOSIS — E2839 Other primary ovarian failure: Secondary | ICD-10-CM

## 2024-03-27 NOTE — Telephone Encounter (Signed)
 Referral replaced with new one to [facility]

## 2024-04-14 DIAGNOSIS — Z0000 Encounter for general adult medical examination without abnormal findings: Secondary | ICD-10-CM | POA: Diagnosis not present

## 2024-04-14 DIAGNOSIS — Z1331 Encounter for screening for depression: Secondary | ICD-10-CM | POA: Diagnosis not present

## 2024-04-14 DIAGNOSIS — M25562 Pain in left knee: Secondary | ICD-10-CM | POA: Diagnosis not present

## 2024-04-14 DIAGNOSIS — Z1339 Encounter for screening examination for other mental health and behavioral disorders: Secondary | ICD-10-CM | POA: Diagnosis not present

## 2024-05-27 ENCOUNTER — Other Ambulatory Visit: Payer: Self-pay | Admitting: Internal Medicine

## 2024-05-27 DIAGNOSIS — Z1231 Encounter for screening mammogram for malignant neoplasm of breast: Secondary | ICD-10-CM

## 2024-07-08 ENCOUNTER — Ambulatory Visit
Admission: RE | Admit: 2024-07-08 | Discharge: 2024-07-08 | Disposition: A | Discharge status: Home / Self Care | Source: Ambulatory Visit | Attending: Internal Medicine | Admitting: Internal Medicine

## 2024-07-08 DIAGNOSIS — Z1231 Encounter for screening mammogram for malignant neoplasm of breast: Secondary | ICD-10-CM

## 2024-07-09 DIAGNOSIS — L570 Actinic keratosis: Secondary | ICD-10-CM | POA: Diagnosis not present

## 2024-07-09 DIAGNOSIS — Z85828 Personal history of other malignant neoplasm of skin: Secondary | ICD-10-CM | POA: Diagnosis not present

## 2024-07-09 DIAGNOSIS — L738 Other specified follicular disorders: Secondary | ICD-10-CM | POA: Diagnosis not present

## 2024-07-09 DIAGNOSIS — L821 Other seborrheic keratosis: Secondary | ICD-10-CM | POA: Diagnosis not present

## 2024-07-09 DIAGNOSIS — D485 Neoplasm of uncertain behavior of skin: Secondary | ICD-10-CM | POA: Diagnosis not present

## 2024-07-09 DIAGNOSIS — L430 Hypertrophic lichen planus: Secondary | ICD-10-CM | POA: Diagnosis not present

## 2024-07-28 DIAGNOSIS — H524 Presbyopia: Secondary | ICD-10-CM | POA: Diagnosis not present

## 2024-07-28 DIAGNOSIS — H52223 Regular astigmatism, bilateral: Secondary | ICD-10-CM | POA: Diagnosis not present

## 2024-07-28 DIAGNOSIS — D3132 Benign neoplasm of left choroid: Secondary | ICD-10-CM | POA: Diagnosis not present

## 2024-07-28 DIAGNOSIS — H5213 Myopia, bilateral: Secondary | ICD-10-CM | POA: Diagnosis not present

## 2024-07-28 DIAGNOSIS — H2513 Age-related nuclear cataract, bilateral: Secondary | ICD-10-CM | POA: Diagnosis not present

## 2024-08-18 ENCOUNTER — Ambulatory Visit: Admitting: Obstetrics and Gynecology
